# Patient Record
Sex: Female | Born: 1950 | Race: White | Hispanic: Yes | Marital: Single | Smoking: Never smoker
Health system: Southern US, Community
[De-identification: ages and names within clinical notes are randomized; demographics above are authoritative.]

## PROBLEM LIST (undated history)

## (undated) DIAGNOSIS — I2510 Atherosclerotic heart disease of native coronary artery without angina pectoris: Secondary | ICD-10-CM

## (undated) DIAGNOSIS — E119 Type 2 diabetes mellitus without complications: Secondary | ICD-10-CM

## (undated) DIAGNOSIS — I10 Essential (primary) hypertension: Secondary | ICD-10-CM

## (undated) HISTORY — PX: TOTAL HIP ARTHROPLASTY: SHX124

---

## 2000-07-31 ENCOUNTER — Inpatient Hospital Stay (HOSPITAL_COMMUNITY): Admission: AC | Admit: 2000-07-31 | Discharge: 2000-08-11 | Payer: Self-pay

## 2000-07-31 ENCOUNTER — Encounter: Payer: Self-pay | Admitting: General Surgery

## 2000-08-01 ENCOUNTER — Encounter: Payer: Self-pay | Admitting: Orthopedic Surgery

## 2000-08-03 ENCOUNTER — Encounter: Payer: Self-pay | Admitting: General Surgery

## 2000-08-10 ENCOUNTER — Encounter: Payer: Self-pay | Admitting: Orthopedic Surgery

## 2000-08-11 ENCOUNTER — Inpatient Hospital Stay (HOSPITAL_COMMUNITY)
Admission: RE | Admit: 2000-08-11 | Discharge: 2000-08-14 | Payer: Self-pay | Admitting: Physical Medicine & Rehabilitation

## 2003-01-24 ENCOUNTER — Ambulatory Visit (HOSPITAL_COMMUNITY): Admission: RE | Admit: 2003-01-24 | Discharge: 2003-01-24 | Payer: Self-pay | Admitting: Family Medicine

## 2003-01-25 ENCOUNTER — Ambulatory Visit (HOSPITAL_COMMUNITY): Admission: RE | Admit: 2003-01-25 | Discharge: 2003-01-25 | Payer: Self-pay | Admitting: Family Medicine

## 2003-02-03 ENCOUNTER — Encounter: Admission: RE | Admit: 2003-02-03 | Discharge: 2003-02-03 | Payer: Self-pay | Admitting: Family Medicine

## 2005-04-02 ENCOUNTER — Inpatient Hospital Stay (HOSPITAL_COMMUNITY): Admission: RE | Admit: 2005-04-02 | Discharge: 2005-04-07 | Payer: Self-pay | Admitting: Orthopedic Surgery

## 2005-05-01 ENCOUNTER — Encounter: Admission: RE | Admit: 2005-05-01 | Discharge: 2005-05-01 | Payer: Self-pay | Admitting: Orthopedic Surgery

## 2006-07-27 IMAGING — CR DG HIP (WITH OR WITHOUT PELVIS) 2-3V*L*
3 series · 3 of 3 positions shown · non-contrast
Comparison: 04/02/05.

CLINICAL DATA: Recent left total hip replacement.  Some pain. 
 LEFT HIP TWO VIEWS:

[view not recorded (1 of 3)]
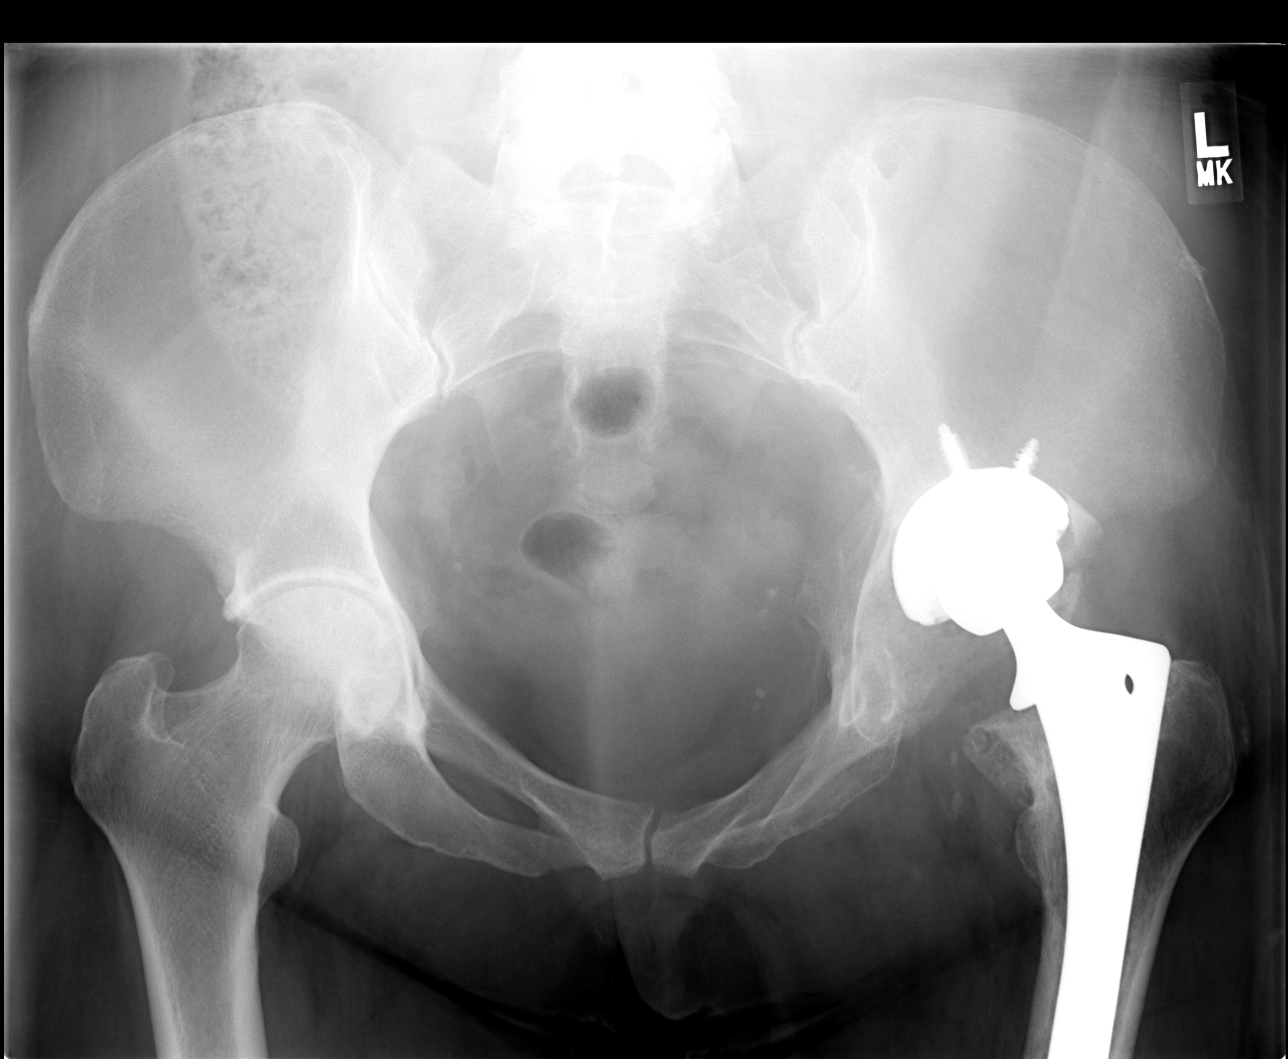

[view not recorded (2 of 3)]
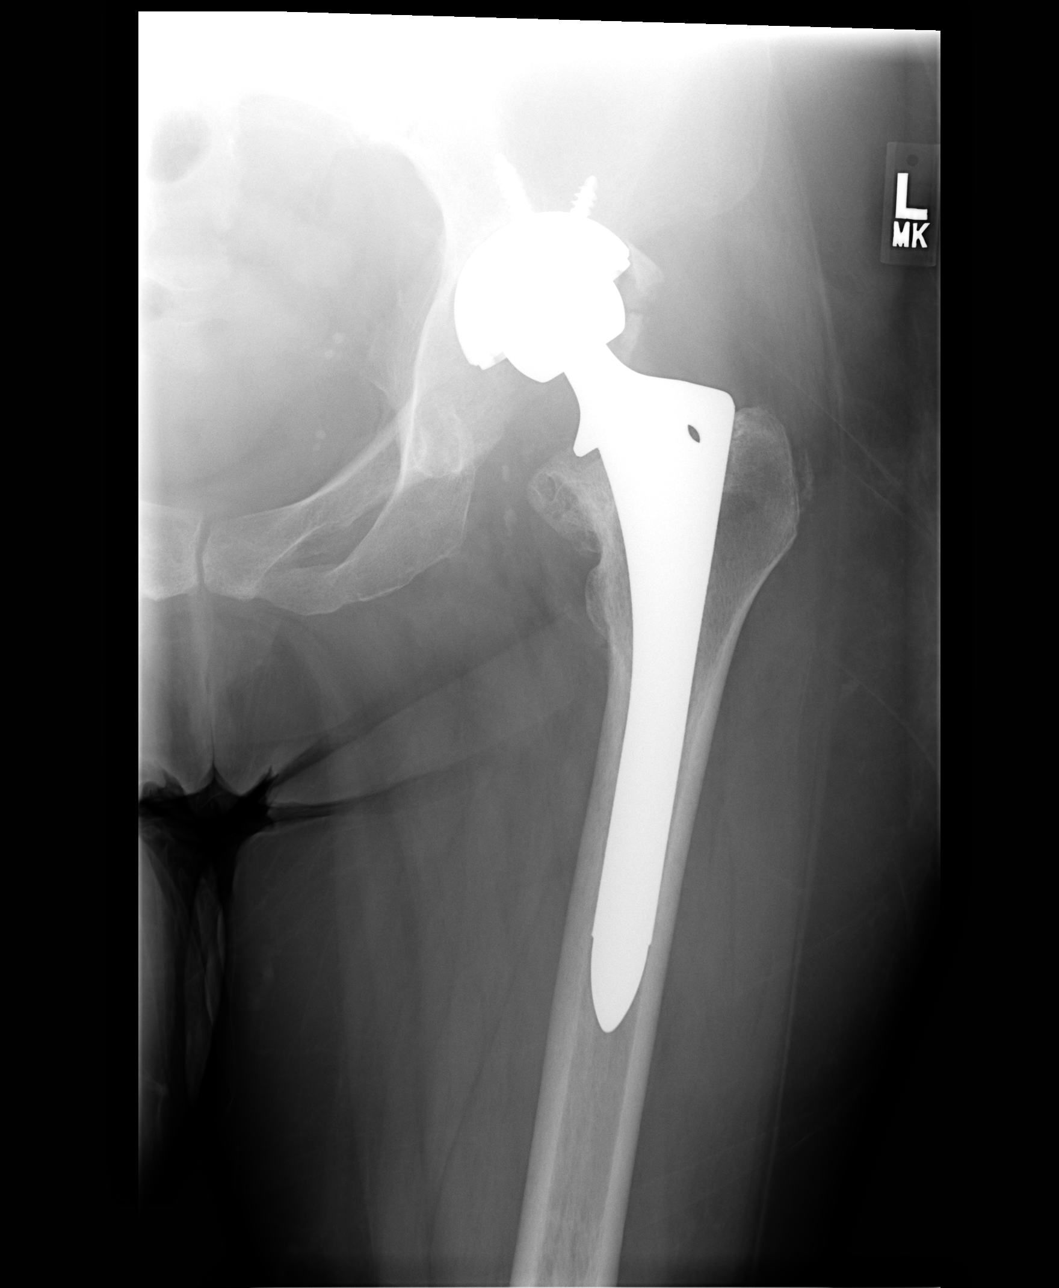

[view not recorded (3 of 3)]
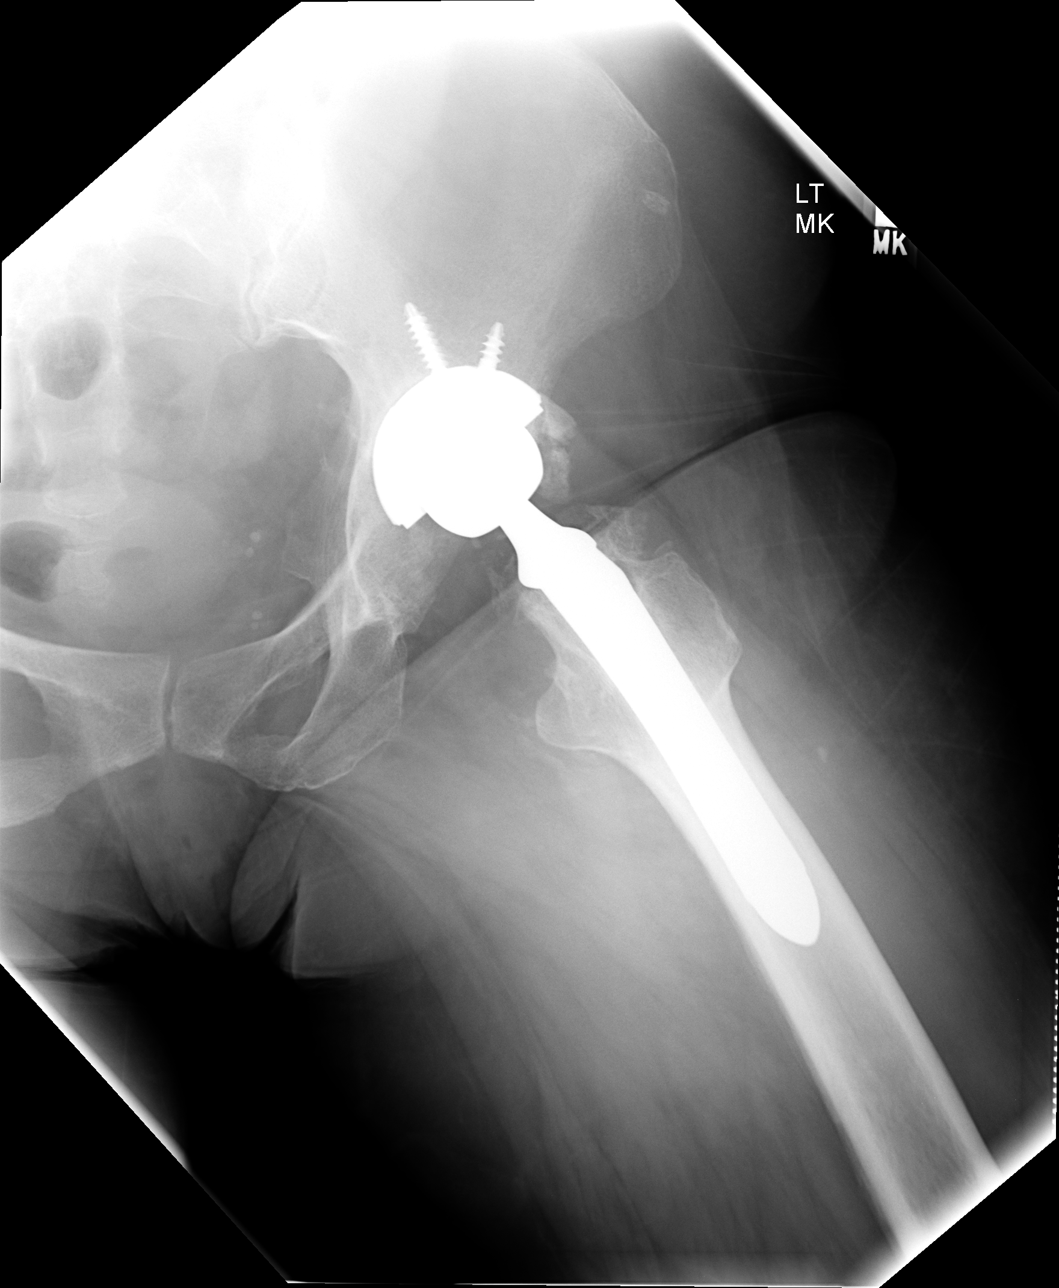

[3 of 3 positions shown; findings below may reference images not displayed]

The acetabular and femoral components of the left total hip replacement appear unchanged in position since the immediate post operative study.  The acetabular component is again noted to be superior lateral to the native acetabulum but the native acetabulum appears distorted.  There is some bone fragment seen lateral to the acetabular cup, unchanged since the prior exam.  There is no evidence of loosening.
IMPRESSION: No change in the appearance of the left hip.  Components appear unchanged in position.

## 2016-11-11 ENCOUNTER — Emergency Department (HOSPITAL_COMMUNITY)
Admission: EM | Admit: 2016-11-11 | Discharge: 2016-11-11 | Disposition: A | Discharge status: Home / Self Care | Payer: Self-pay | Attending: Emergency Medicine | Admitting: Emergency Medicine

## 2016-11-11 ENCOUNTER — Encounter (HOSPITAL_COMMUNITY): Payer: Self-pay | Admitting: Emergency Medicine

## 2016-11-11 DIAGNOSIS — Z96649 Presence of unspecified artificial hip joint: Secondary | ICD-10-CM | POA: Insufficient documentation

## 2016-11-11 DIAGNOSIS — I10 Essential (primary) hypertension: Secondary | ICD-10-CM | POA: Insufficient documentation

## 2016-11-11 DIAGNOSIS — E119 Type 2 diabetes mellitus without complications: Secondary | ICD-10-CM | POA: Insufficient documentation

## 2016-11-11 DIAGNOSIS — I2510 Atherosclerotic heart disease of native coronary artery without angina pectoris: Secondary | ICD-10-CM | POA: Insufficient documentation

## 2016-11-11 DIAGNOSIS — R1030 Lower abdominal pain, unspecified: Secondary | ICD-10-CM | POA: Insufficient documentation

## 2016-11-11 HISTORY — DX: Atherosclerotic heart disease of native coronary artery without angina pectoris: I25.10

## 2016-11-11 HISTORY — DX: Type 2 diabetes mellitus without complications: E11.9

## 2016-11-11 HISTORY — DX: Essential (primary) hypertension: I10

## 2016-11-11 LAB — I-STAT CHEM 8, ED
BUN: 25 mg/dL — ABNORMAL HIGH (ref 6–20)
CALCIUM ION: 1.18 mmol/L (ref 1.15–1.40)
CHLORIDE: 103 mmol/L (ref 101–111)
CREATININE: 0.9 mg/dL (ref 0.44–1.00)
GLUCOSE: 161 mg/dL — AB (ref 65–99)
HCT: 36 % (ref 36.0–46.0)
HEMOGLOBIN: 12.2 g/dL (ref 12.0–15.0)
POTASSIUM: 4.2 mmol/L (ref 3.5–5.1)
Sodium: 139 mmol/L (ref 135–145)
TCO2: 25 mmol/L (ref 22–32)

## 2016-11-11 LAB — URINALYSIS, ROUTINE W REFLEX MICROSCOPIC
Bilirubin Urine: NEGATIVE
GLUCOSE, UA: NEGATIVE mg/dL
HGB URINE DIPSTICK: NEGATIVE
Ketones, ur: NEGATIVE mg/dL
LEUKOCYTES UA: NEGATIVE
Nitrite: NEGATIVE
PH: 6 (ref 5.0–8.0)
PROTEIN: NEGATIVE mg/dL
SPECIFIC GRAVITY, URINE: 1.024 (ref 1.005–1.030)

## 2016-11-11 LAB — CBC WITH DIFFERENTIAL/PLATELET
BASOS ABS: 0.1 10*3/uL (ref 0.0–0.1)
Basophils Relative: 1 %
EOS PCT: 2 %
Eosinophils Absolute: 0.1 10*3/uL (ref 0.0–0.7)
HEMATOCRIT: 36.1 % (ref 36.0–46.0)
Hemoglobin: 11.4 g/dL — ABNORMAL LOW (ref 12.0–15.0)
LYMPHS ABS: 1.8 10*3/uL (ref 0.7–4.0)
LYMPHS PCT: 32 %
MCH: 25.6 pg — AB (ref 26.0–34.0)
MCHC: 31.6 g/dL (ref 30.0–36.0)
MCV: 81.1 fL (ref 78.0–100.0)
MONO ABS: 0.5 10*3/uL (ref 0.1–1.0)
Monocytes Relative: 8 %
NEUTROS ABS: 3.3 10*3/uL (ref 1.7–7.7)
Neutrophils Relative %: 57 %
Platelets: 326 10*3/uL (ref 150–400)
RBC: 4.45 MIL/uL (ref 3.87–5.11)
RDW: 15 % (ref 11.5–15.5)
WBC: 5.8 10*3/uL (ref 4.0–10.5)

## 2016-11-11 LAB — CBG MONITORING, ED: Glucose-Capillary: 151 mg/dL — ABNORMAL HIGH (ref 65–99)

## 2016-11-11 MED ORDER — LOSARTAN POTASSIUM 25 MG PO TABS: 25.0000 mg | ORAL_TABLET | Freq: Every day | ORAL | 0 refills | Status: AC

## 2016-11-11 NOTE — ED Provider Notes (Signed)
[facility]-EMERGENCY DEPT Provider Note   CSN: [id number] Arrival date & time: 11/11/16  1029     History   Chief Complaint Chief Complaint  Patient presents with  . Fatigue   Patient speaks Spanish primarily and was translated by her daughter. HPI Jennifer Burgess is a 66 y.o. female.  HPI Patient presents with headache. Fatigue. Slight dizziness. Lower abdominal pain and urinary frequency. She is visiting family members here and does not live here. History of diabetes and hypertension. States she's been taking her medicines. Has been getting up frequently to urinate at night. States that she feels as if she has to urinate even after she goes. No weight loss. Patient thinks it may be due to eating different food here. Patient is on only oral medications for her diabetes and states she's been taking them. Past Medical History:  Diagnosis Date  . Coronary artery disease   . Diabetes mellitus without complication (HCC)   . Hypertension     There are no active problems to display for this patient.   Past Surgical History:  Procedure Laterality Date  . TOTAL HIP ARTHROPLASTY      OB History    No data available       Home Medications    Prior to Admission medications   Medication Sig Start Date End Date Taking? Authorizing Provider  losartan (COZAAR) 25 MG tablet Take 1 tablet (25 mg total) by mouth daily. 11/11/16   Benjiman CorePickering, Ubaldo Daywalt, MD    Family History No family history on file.  Social History Social History  Substance Use Topics  . Smoking status: Never Smoker  . Smokeless tobacco: Not on file  . Alcohol use No     Allergies   Patient has no known allergies.   Review of Systems Review of Systems  Constitutional: Negative for appetite change, fatigue and fever.  HENT: Negative for congestion.   Respiratory: Negative for cough and shortness of breath.   Cardiovascular: Negative for chest pain.  Gastrointestinal: Positive for abdominal pain.    Endocrine: Positive for polyuria.  Genitourinary: Positive for frequency.  Musculoskeletal: Negative for back pain.  Neurological: Positive for headaches.  Hematological: Negative for adenopathy.  Psychiatric/Behavioral: Negative for confusion.     Physical Exam Updated Vital Signs BP (!) 149/74 (BP Location: Right Arm)   Pulse 81   Temp 98.2 F (36.8 C) (Oral)   Resp 20   SpO2 99%   Physical Exam  Constitutional: She appears well-developed.  HENT:  Head: Atraumatic.  Eyes: Pupils are equal, round, and reactive to light.  Neck: Neck supple.  Cardiovascular: Normal rate.   Pulmonary/Chest: Effort normal.  Abdominal: There is tenderness.  Mild suprapubic tenderness without rebound or guarding.  Musculoskeletal: She exhibits no edema.  Neurological: She is alert.  Skin: Skin is warm. Capillary refill takes less than 2 seconds.  Psychiatric: She has a normal mood and affect.     ED Treatments / Results  Labs (all labs ordered are listed, but only abnormal results are displayed) Labs Reviewed  CBC WITH DIFFERENTIAL/PLATELET - Abnormal; Notable for the following:       Result Value   Hemoglobin 11.4 (*)    MCH 25.6 (*)    All other components within normal limits  CBG MONITORING, ED - Abnormal; Notable for the following:    Glucose-Capillary 151 (*)    All other components within normal limits  I-STAT CHEM 8, ED - Abnormal; Notable for the following:    BUN 25 (*)  Glucose, Bld 161 (*)    All other components within normal limits  URINALYSIS, ROUTINE W REFLEX MICROSCOPIC    EKG  EKG Interpretation None       Radiology No results found.  Procedures Procedures (including critical care time)  Medications Ordered in ED Medications - No data to display   Initial Impression / Assessment and Plan / ED Course  I have reviewed the triage vital signs and the nursing notes.  Pertinent labs & imaging results that were available during my care of the patient  were reviewed by me and considered in my medical decision making (see chart for details).     Patient with various complaints. Dull headache. No chest pain. Has some hypertension has been off her blood pressure medicine. Reportedly was on losartan and will refill it. Has labs reassuring except mild hyperglycemia. On metformin. Slight tenderness on abdomen but no white count elevation. Discussed with patient will hold off on CT scan for now. Discharge home.  Final Clinical Impressions(s) / ED Diagnoses   Final diagnoses:  Hypertension, unspecified type  Lower abdominal pain    New Prescriptions New Prescriptions   LOSARTAN (COZAAR) 25 MG TABLET    Take 1 tablet (25 mg total) by mouth daily.     Benjiman Core, MD 11/11/16 2018

## 2016-11-11 NOTE — ED Notes (Signed)
Signature pad not working, pt unable to sign.   

## 2016-11-11 NOTE — ED Triage Notes (Signed)
Pt arrives via POv from home with daughter interpreting. Reports 2 weeks of not feeling well, fatigued, headache, dizzy.  Denies CP and SOB.
